# Patient Record
Sex: Male | Born: 1985 | Hispanic: Yes | Marital: Married
Health system: Southern US, Community
[De-identification: ages and names within clinical notes are randomized; demographics above are authoritative.]

---
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
## 2014-04-02 ENCOUNTER — Inpatient Hospital Stay: Payer: Self-pay | Admitting: Internal Medicine

## 2014-04-02 LAB — CBC WITH DIFFERENTIAL/PLATELET
BASOS PCT: 0.5 %
Basophil #: 0 10*3/uL (ref 0.0–0.1)
EOS ABS: 0 10*3/uL (ref 0.0–0.7)
Eosinophil %: 0.2 %
HCT: 40.7 % (ref 40.0–52.0)
HGB: 13.7 g/dL (ref 13.0–18.0)
LYMPHS ABS: 2 10*3/uL (ref 1.0–3.6)
LYMPHS PCT: 21.2 %
MCH: 25.9 pg — ABNORMAL LOW (ref 26.0–34.0)
MCHC: 33.5 g/dL (ref 32.0–36.0)
MCV: 77 fL — ABNORMAL LOW (ref 80–100)
MONO ABS: 0.9 x10 3/mm (ref 0.2–1.0)
Monocyte %: 9.5 %
NEUTROS ABS: 6.5 10*3/uL (ref 1.4–6.5)
Neutrophil %: 68.6 %
PLATELETS: 269 10*3/uL (ref 150–440)
RBC: 5.28 10*6/uL (ref 4.40–5.90)
RDW: 13.3 % (ref 11.5–14.5)
WBC: 9.5 10*3/uL (ref 3.8–10.6)

## 2014-04-02 LAB — BASIC METABOLIC PANEL
Anion Gap: 8 (ref 7–16)
BUN: 18 mg/dL (ref 7–18)
CHLORIDE: 105 mmol/L (ref 98–107)
CREATININE: 1.26 mg/dL (ref 0.60–1.30)
Calcium, Total: 9.2 mg/dL (ref 8.5–10.1)
Co2: 25 mmol/L (ref 21–32)
GLUCOSE: 118 mg/dL — AB (ref 65–99)
Osmolality: 279 (ref 275–301)
Potassium: 3.7 mmol/L (ref 3.5–5.1)
Sodium: 138 mmol/L (ref 136–145)

## 2014-04-02 LAB — URINALYSIS, COMPLETE
BLOOD: NEGATIVE
Bacteria: NONE SEEN
Bilirubin,UR: NEGATIVE
Glucose,UR: NEGATIVE mg/dL (ref 0–75)
Ketone: NEGATIVE
Leukocyte Esterase: NEGATIVE
Nitrite: NEGATIVE
PROTEIN: NEGATIVE
Ph: 8 (ref 4.5–8.0)
RBC,UR: 15 /HPF (ref 0–5)
Specific Gravity: 1.021 (ref 1.003–1.030)
Squamous Epithelial: NONE SEEN
WBC UR: 1 /HPF (ref 0–5)

## 2014-04-02 LAB — PROTIME-INR
INR: 1.1
Prothrombin Time: 13.9 secs (ref 11.5–14.7)

## 2014-04-02 LAB — GLUCOSE, CSF: Glucose, CSF: 66 mg/dL (ref 40–75)

## 2014-04-02 LAB — CSF CELL COUNT WITH DIFFERENTIAL
CSF TUBE #: 3
EOS PCT: 0 %
Lymphocytes: 24 %
Monocytes/Macrophages: 8 %
Neutrophils: 68 %
Other Cells: 0 %
RBC (CSF): 55 /mm3
WBC (CSF): 368 /mm3

## 2014-04-02 LAB — PROTEIN, CSF: PROTEIN, CSF: 76 mg/dL — AB (ref 15–45)

## 2014-04-06 LAB — CSF CULTURE W GRAM STAIN

## 2014-04-07 LAB — CULTURE, BLOOD (SINGLE)

## 2014-11-20 IMAGING — CR DG CHEST 1V PORT
1 series · 1 of 1 positions shown · non-contrast
Comparison: None.

CLINICAL DATA: Neck stiffness, headache, fever

EXAM:
PORTABLE CHEST - 1 VIEW

[x chest ap]
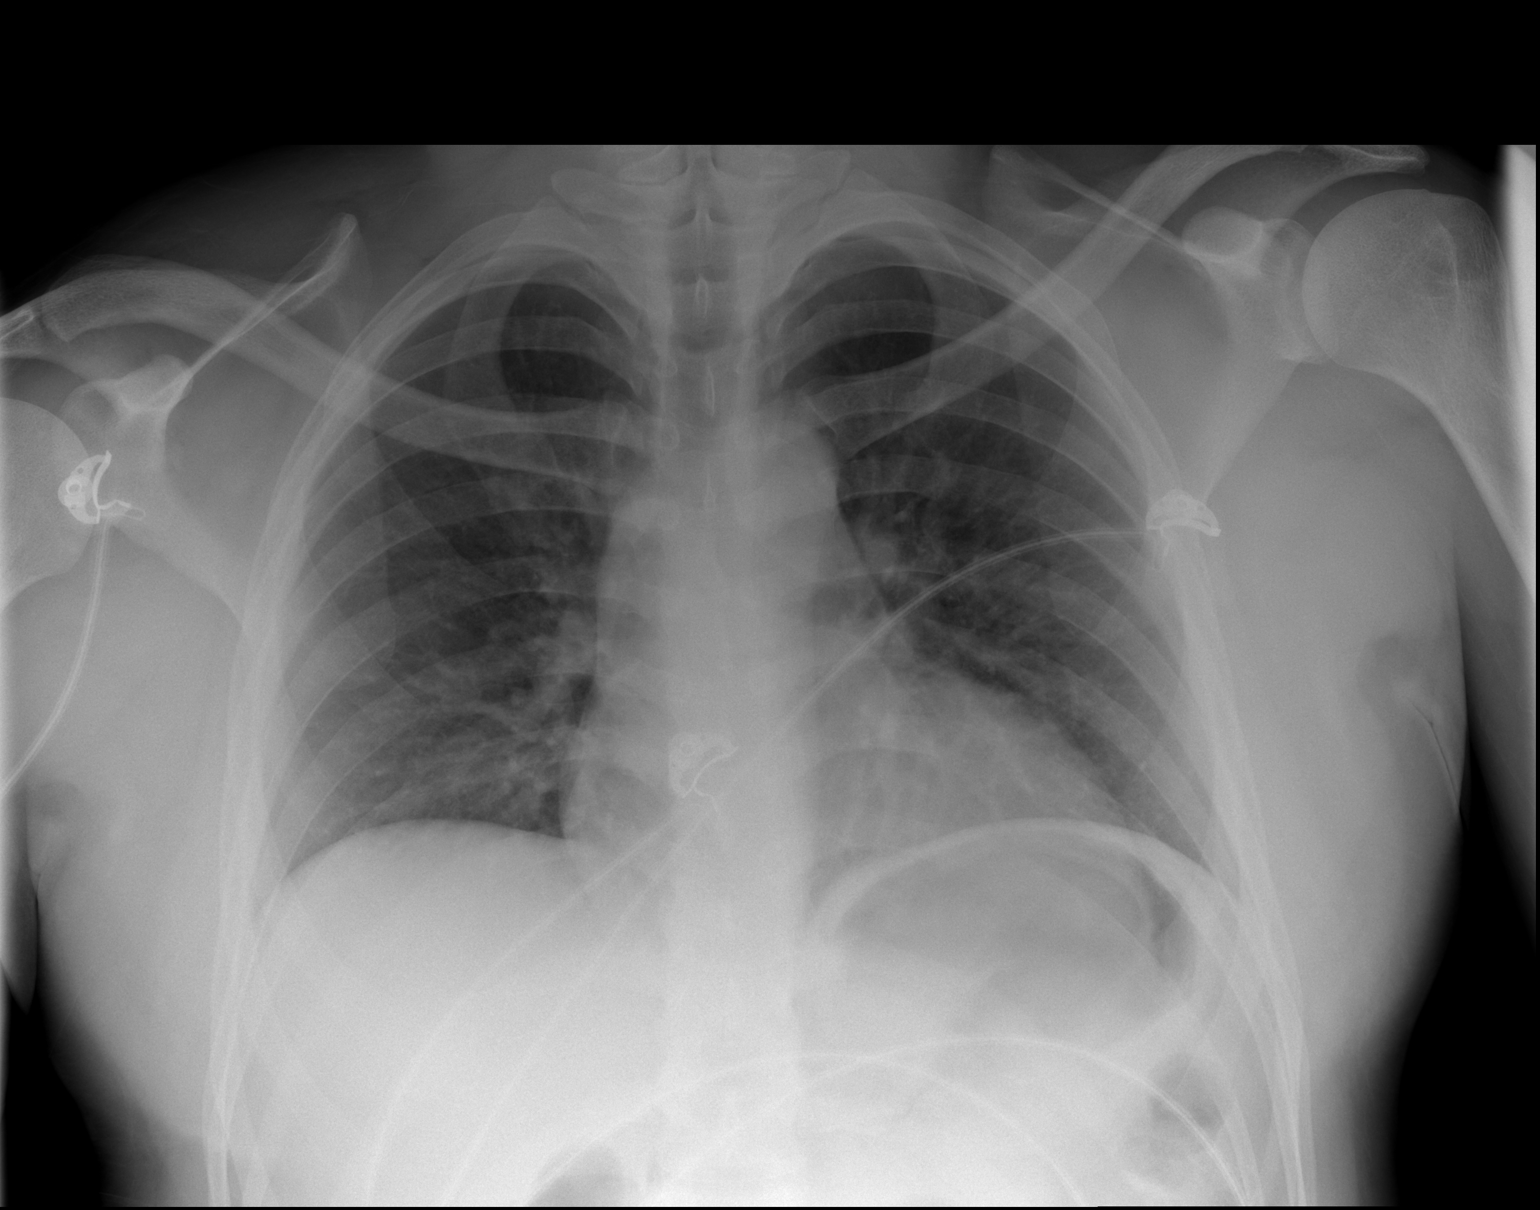

[1 of 1 positions shown; findings below may reference images not displayed]

FINDINGS: The lungs are not well aerated. However no pneumonia is seen. No
effusion is noted. Mediastinal and hilar contours are unremarkable.
The heart is within normal limits in size. No bony abnormality is
seen.
IMPRESSION: Suboptimal inspiration.  No active lung disease.

## 2014-11-20 IMAGING — CT CT HEAD WITHOUT CONTRAST
1 series · 16 of 30 positions shown, 20 images · non-contrast
Comparison: None.

CLINICAL DATA: Headache, fever, neck stiffness

EXAM:
CT HEAD WITHOUT CONTRAST
TECHNIQUE: Contiguous axial images were obtained from the base of the skull
through the vertex without intravenous contrast.

[Series 2: head wo · axial · 0.42mm/px · z∈[+1146,+1290]mm · 16 of 36 slices shown, 20 images]
[im 2/36  brain]
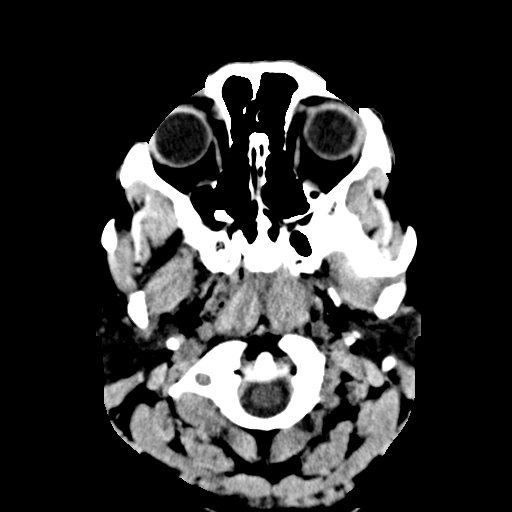
[im 2/36  bone]
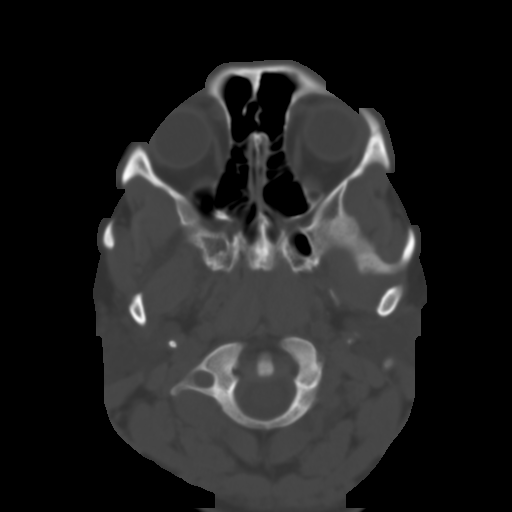
[im 4/36  brain]
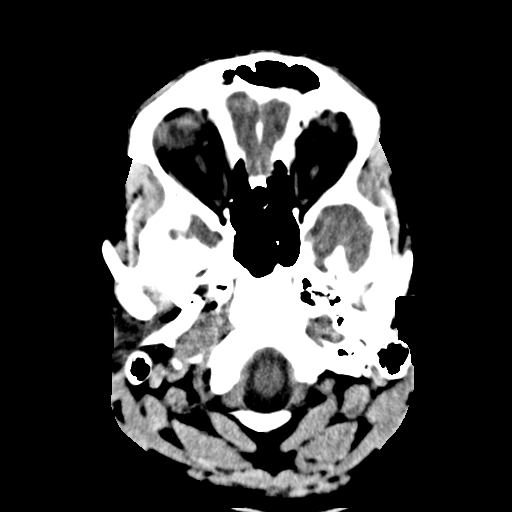
[im 7/36  brain]
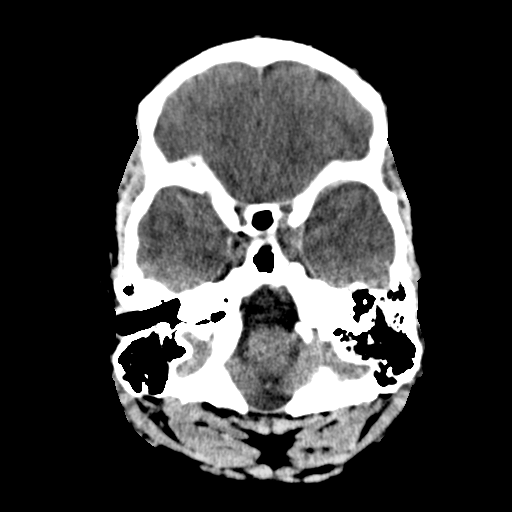
[im 9/36  brain]
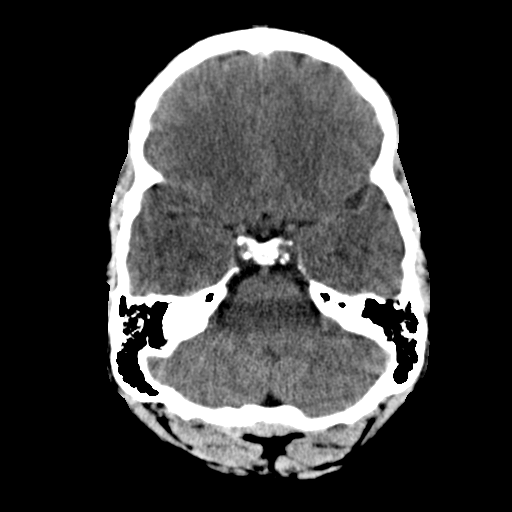
[im 10/36  brain]
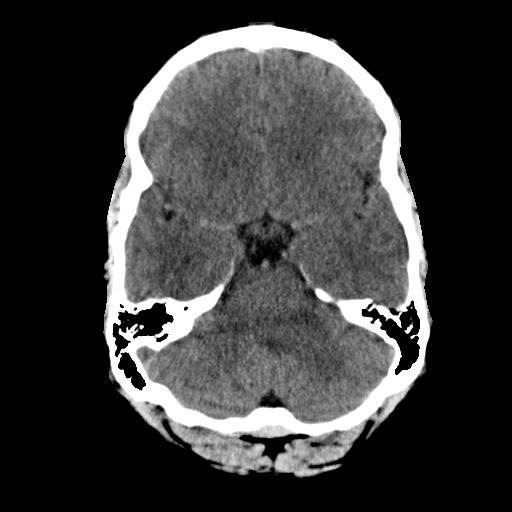
[im 10/36  bone]
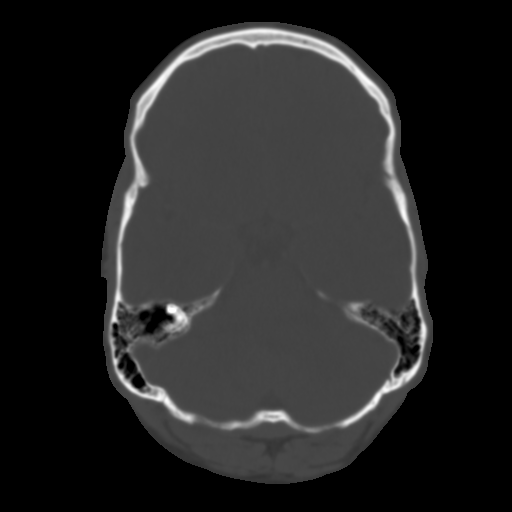
[im 13/36  brain]
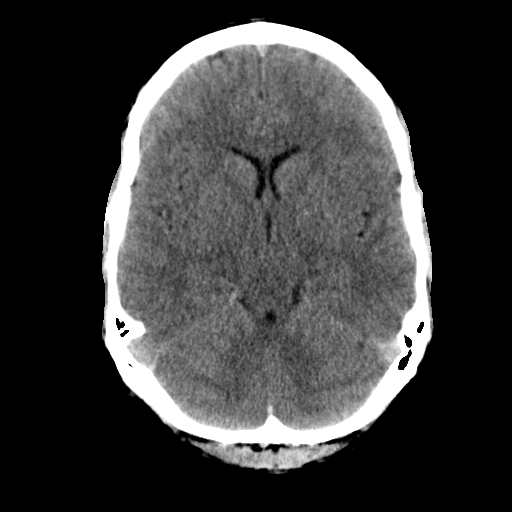
[im 15/36  brain]
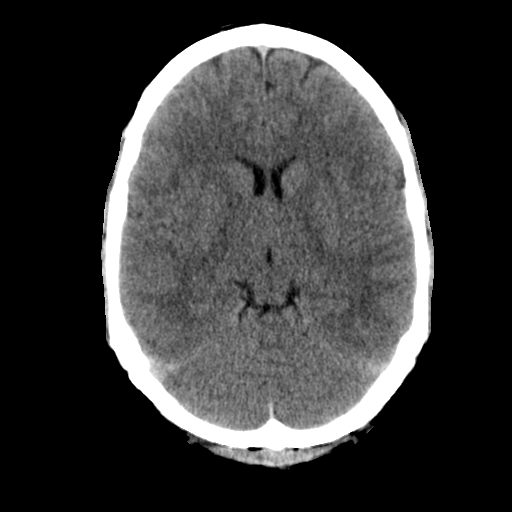
[im 17/36  brain]
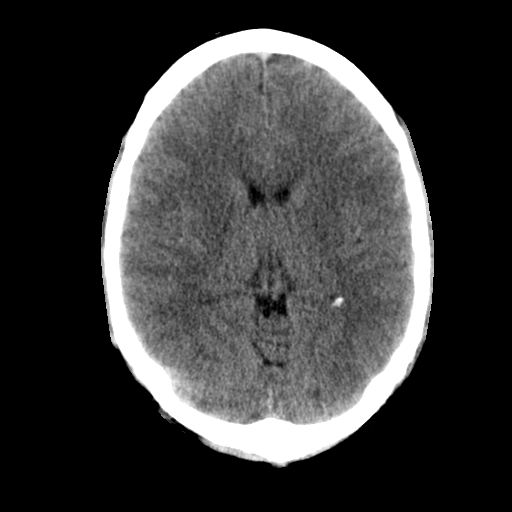
[im 19/36  brain]
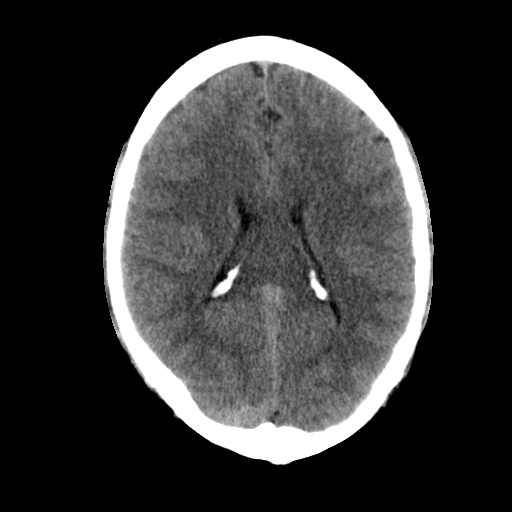
[im 19/36  bone]
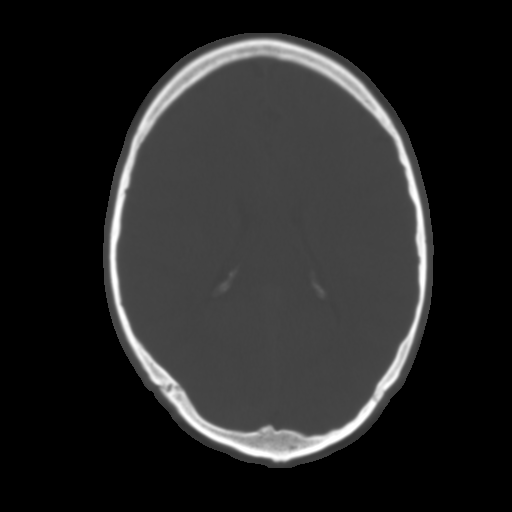
[im 21/36  brain]
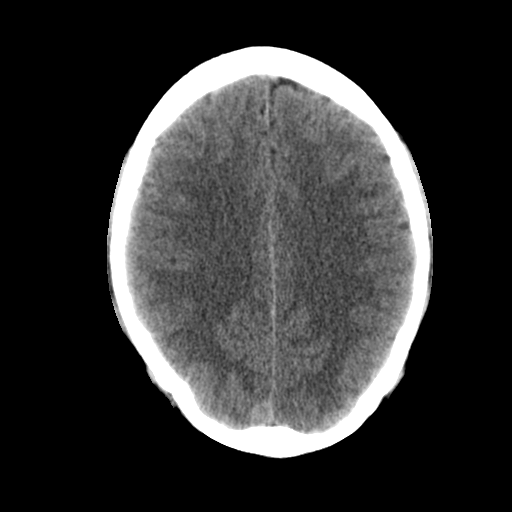
[im 23/36  brain]
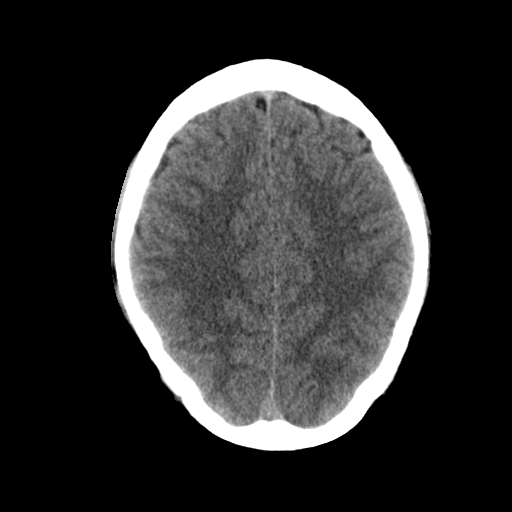
[im 26/36  brain]
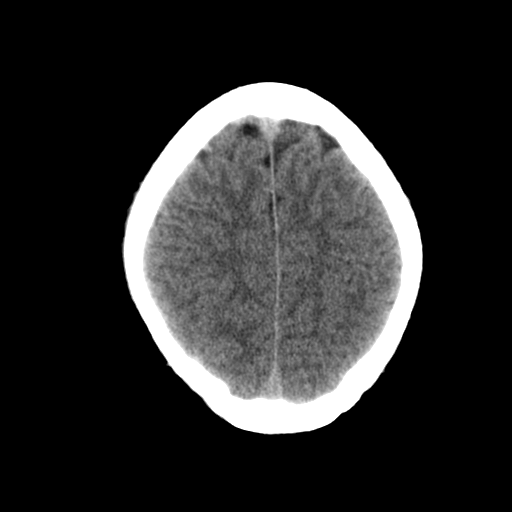
[im 27/36  brain]
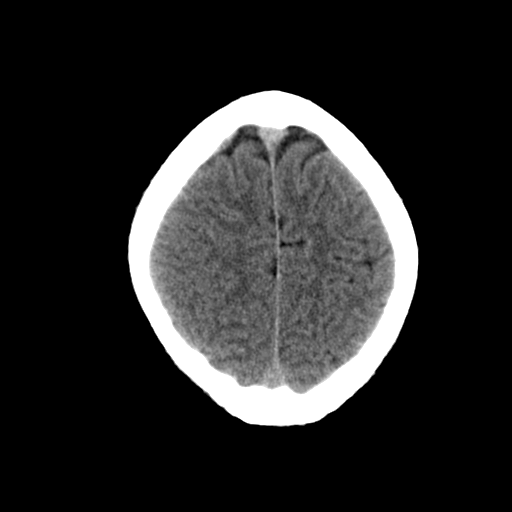
[im 27/36  bone]
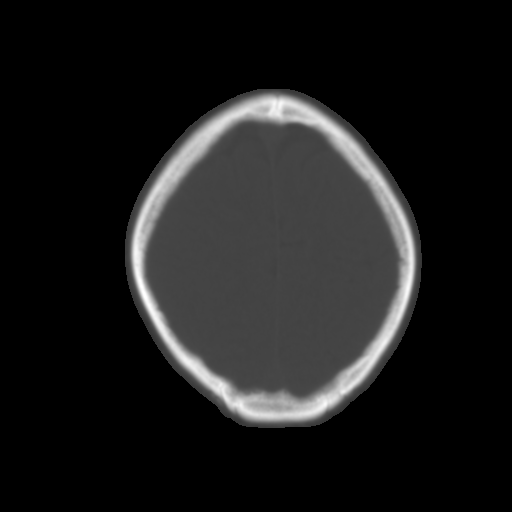
[im 29/36  brain]
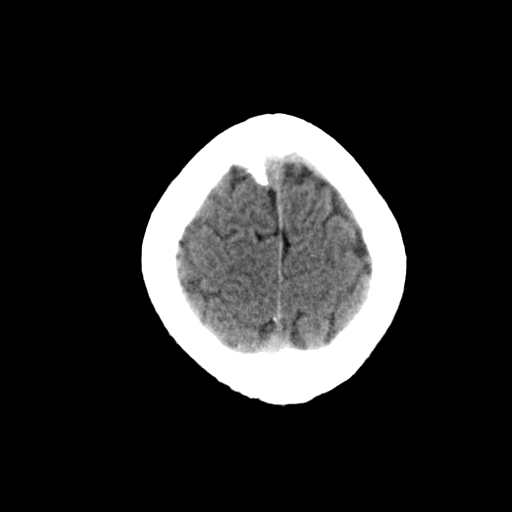
[im 32/36  brain]
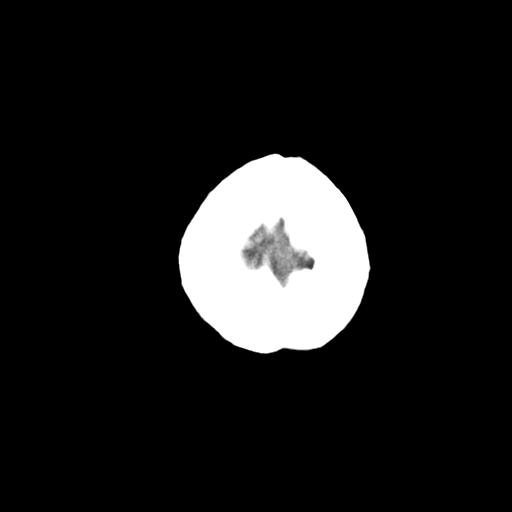
[im 34/36  brain]
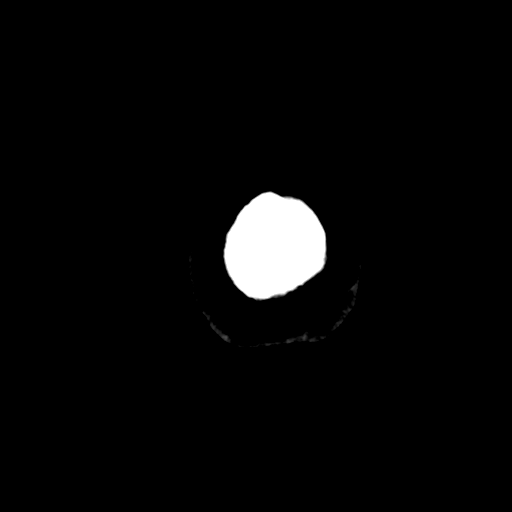

[16 of 30 positions shown; findings below may reference images not displayed]

FINDINGS: No skull fracture is noted. Paranasal sinuses and mastoid air cells
are unremarkable.

No hydrocephalus. No intracranial hemorrhage, mass effect or midline
shift. The gray and white-matter differentiation is preserved. No
acute infarction. No mass lesion is noted on this unenhanced scan.
IMPRESSION: No acute intracranial abnormality.

## 2015-01-24 NOTE — Discharge Summary (Signed)
PATIENT NAME:  Stuart Payne, Stuart Payne MR#:  [id number] DATE OF BIRTH:  [birth year]  DATE OF ADMISSION:  04/02/2014 DATE OF DISCHARGE:  04/03/2014  DISCHARGE DIAGNOSIS: Aseptic meningitis, improved.   SECONDARY DIAGNOSIS: None.   PROCEDURES AND RADIOLOGY: Lumbar puncture in the Emergency Department.   Chest x-ray on the 1st of July showed no acute cardiopulmonary disease.   CT scan of the head without contrast in the ED showed no acute intracranial abnormality.   MAJOR LABORATORY PANEL: Urinalysis on admission was negative. CSF culture had no growth, many WBCs. Blood cultures x2 were negative.   Malarial smear was negative.   HISTORY AND SHORT HOSPITAL COURSE: The patient is a 29 year old healthy male with no medical problems, who was admitted for fever and severe headache, thought to have meningitis. Please see Dr. Deatra Inaavid Hower's dictated history and physical for further details. The patient underwent lumbar puncture with CSF studies sent out, and they were all negative. Infectious disease consultation was obtained with Dr. Clydie Braunavid Fitzgerald, who recommended to stop all antibiotics, as the patient's culture remained negative. He was asymptomatic. This was thought to be aseptic meningitis, and the patient was discharged home the on 2nd of July, in stable condition. On the date of discharge, his vital signs are as follows: Temperature 98, heart rate 78 per minute, respirations 20 per minute, blood pressure 114/74 mmHg. He was saturating 96% on room air.   PERTINENT PHYSICAL EXAMINATION ON THE DATE OF DISCHARGE:  CARDIOVASCULAR: S1, S2 normal. No murmurs, rubs or gallop.  LUNGS: Clear to auscultation bilaterally. No wheezing, rales, rhonchi or crepitation.  ABDOMEN: Soft, benign.  NEUROLOGIC: Nonfocal examination.  All other physical examination remained at baseline.   DISCHARGE MEDICATIONS: None.   DISCHARGE DIET: Regular.   DISCHARGE ACTIVITY: As tolerated.   DISCHARGE INSTRUCTIONS AND  FOLLOW-UP: The patient was instructed to follow up with his physician [place] [facility] [place] [employer] [place] in 1 to 2 weeks if needed. He was also given the number for Dr. Clydie Braunavid Fitzgerald for follow up.   TOTAL TIME SPENT DISCHARGING THIS PATIENT: 40 minutes.   ____________________________ Ellamae SiaVipul S. Sherryll BurgerShah, MD vss:cg D: 04/04/2014 19:57:43 ET T: 04/05/2014 05:30:46 ET JOB#: [id number]  cc: Lockie Bothun S. Sherryll BurgerShah, MD, <Dictator> Stann Mainlandavid P. Sampson GoonFitzgerald, MD Ellamae SiaVIPUL S [facility] MD ELECTRONICALLY SIGNED 04/06/2014 18:41

## 2015-01-24 NOTE — H&P (Signed)
PATIENT NAME:  Stuart Payne, [name] MR#:  [id number] DATE OF BIRTH:  August 07, 1986  DATE OF ADMISSION:  04/02/2014  REFERRING PHYSICIAN:  Dr. Carollee MassedKaminski.   PRIMARY CARE PHYSICIAN:  Nonlocal.   CHIEF COMPLAINT:  Fever and headache.   HISTORY OF PRESENT ILLNESS:  A 29 year old Hispanic gentleman without significant past medical history presenting with fever and headache.  Describes one day duration of fever up to 103 degrees Fahrenheit at home with associated headache.  Describes headache as throbbing in the entire portion of his head and 10 out of 10 in intensity, nonradiating, worse with light as well as loud noise.  No relieving factors.  States that associated neck stiffness.  Of note, recently returned from [place] after a two week vacation.  Lumbar puncture performed in the Emergency Department.  Currently the patient is feeling much improved.   REVIEW OF SYSTEMS:   CONSTITUTIONAL:  Positive for fevers, chills.  Denies any fatigue, weakness.  EYES:  Denies blurred vision, double vision or eye pain.  EARS, NOSE, THROAT:  Denies tinnitus, ear pain, hearing loss.  RESPIRATORY:  Denies cough, wheeze, shortness of breath.  CARDIOVASCULAR:  Denies chest pain, palpitations, edema.  GASTROINTESTINAL:  Denies nausea, vomiting, diarrhea, abdominal pain.  GENITOURINARY:  Denies dysuria, hematuria.  ENDOCRINE:  Denies nocturia or thyroid problems.  HEMATOLOGIC AND LYMPHATIC:  Denies easy bruising, bleeding.  SKIN:  Denies rashes or lesions.  MUSCULOSKELETAL:  Positive for neck pain as described above.  Denies any back, shoulder, knee or hip pain.  Denies any arthritic symptoms.  NEUROLOGIC:  Denies any paralysis or paresthesias.  PSYCHIATRIC:  Denies anxiety or depressive symptoms.  Otherwise, full review of systems performed by me is negative.   PAST MEDICAL HISTORY:  None.   SOCIAL HISTORY:  Occasional alcohol as well as occasional tobacco usage.  Denies any drug usage.  Recent travel history to  [place] two week duration, just returned.   FAMILY HISTORY:  Denies any cardiovascular or pulmonary disorders.   ALLERGIES:  No known drug allergies.   HOME MEDICATIONS:  None.   PHYSICAL EXAMINATION: VITAL SIGNS:  Temperature 103 degrees Fahrenheit, heart rate 95, respirations 18, blood pressure 108/64, saturating 100% on room air.  Weight 79.4 kg, BMI 30.1.  GENERAL:  Well-nourished, well-developed Hispanic gentleman, currently in no acute distress.  HEAD:  Normocephalic, atraumatic.  EYES:  Pupils equal, round, reactive to light.  Extraocular muscles intact.  No scleral icterus.  MOUTH:  Moist mucosal membrane.  Dentition intact.  No abscess noted.  EAR, NOSE, THROAT:  Clear without exudates.  No external lesions.   NECK:  Currently neck supple, however the patient states this is different from his arrival.  No thyromegaly.  No nodules.  No JVD.  PULMONARY:  Clear to auscultation bilaterally without wheezes, rubs or rhonchi.  No use of accessory muscles.  Good respiratory effort.  CHEST:  Nontender to palpation.  CARDIOVASCULAR:  S1, S2, regular rate and rhythm.  No murmurs, rubs, or gallops.  No edema.  Pedal pulses 2+ bilaterally.  GASTROINTESTINAL:  Soft, nontender, nondistended.  No masses.  Positive bowel sounds.  No hepatosplenomegaly.  MUSCULOSKELETAL:  Denies swelling, clubbing, or edema.  Range of motion full in all extremities.  Brudzinski as well as Kernig's test are both within normal limits.  NEUROLOGIC:  Cranial nerves II through XII intact.  No gross focal neurological deficits.  Sensation intact.  Reflexes intact. SKIN:  No ulceration, lesions, rash or cyanosis.  Skin warm, dry.  Turgor intact.  PSYCHIATRIC:  Mood and affect within normal limits.  The patient awake, oriented x 3.  Insight and judgment intact.   LABORATORY DATA:  Sodium 138, potassium 3.7, chloride 105, bicarb 25, BUN 18, creatinine 1.26, glucose 118.  WBC 9.5, hemoglobin 13.7, platelets of 269.  INR 1.1.   Chest x-ray performed revealing no acute cardiopulmonary process.  CT head performed revealing no acute intracranial process.  Urinalysis negative for evidence of infection.  CSF studies WBCs 365, neutrophil predominance at 68, glucose 66, protein elevated at 76.  CSF, Gram stain, many WBCs and no organisms present.   ASSESSMENT AND PLAN:  A 29 year old Hispanic gentleman presenting with fevers as well as headache and treat for possible meningitis.  1.  Meningitis.  Lumbar puncture performed.  We will cover for bacterial meningitis and await the finalization of CSF results.  He does have leukocytosis 368, predominantly neutrophils which could represent early bacterial meningitis versus viral.  Regardless, ceftriaxone 2 grams intravenous twice daily, vancomycin 15 mg/kg twice daily as well as Decadron 0.15 mg/kg twice daily.  We will follow Gram stain and culture data.  Place on droplet precautions for the first 24 hours after initiation of antibiotic therapy.  If not bacterial meningitis, can discontinue isolation precautions.  If not strep pneumonia meningitis, can discontinue Decadron.  If it turns out to be neisseria meningococcal meningitis, provide Cipro 500 mg by mouth x 1 for all close contacts, meaning less than 3 feet for greater than eight hours.  2.  Venous thromboembolism prophylaxis with sequential compression devices.  3.  CODE STATUS:  THE PATIENT IS FULL CODE.   TIME SPENT:  45 minutes.    ____________________________ Cletis Athens. Hower, MD dkh:ea D: 04/02/2014 22:39:03 ET T: 04/02/2014 23:12:12 ET JOB#: [id number]  cc: Cletis Athens. Hower, MD, <Dictator> DAVID Synetta Shadow MD ELECTRONICALLY SIGNED 04/03/2014 2:13

## 2015-01-24 NOTE — Consult Note (Signed)
Brief Consult Note: Diagnosis: Aseptic meningitis likely viral.   Patient was seen by consultant.   Consult note dictated.   Recommend to proceed with surgery or procedure.   Recommend further assessment or treatment.   Orders entered.   Discussed with Attending MD.   Comments: Ok for dc off abx and follow cultures. I advised if worsens to return immedialely. I added hsv and enteroviral pcrs to csf already done.  Electronic Signatures: Dierdre HarnessFitzgerald, Lukah Goswami Patrick (MD)  (Signed 02-Jul-15 13:31)  Authored: Brief Consult Note   Last Updated: 02-Jul-15 13:31 by Dierdre HarnessFitzgerald, Danyia Borunda Patrick (MD)

## 2015-01-24 NOTE — Consult Note (Signed)
PATIENT NAME:  Stuart Payne, Stuart Payne MR#:  [id number] DATE OF BIRTH:  1986-03-11  DATE OF CONSULTATION:  04/03/2014  REFERRING PHYSICIAN:  Dr. Sherryll Burger. CONSULTING PHYSICIAN:  Stann Mainland. Sampson Goon, MD  REASON FOR CONSULT: Meningitis.   HISTORY OF PRESENT ILLNESS: This is a very pleasant 29 year old gentleman who is in the [employer] at [place]. He recently returned from a trip to [place] with his wife and child. On the way back, he started to develop a headache. He then developed high fevers and was having  chills. He came to the Emergency Room and had a lumbar puncture which revealed over 300 white blood cells. He was admitted and started on antibiotics and steroids.   The patient reports that while in [place], he visited family and friends. He had no animal contact. He, his wife, and daughter and other travelers with them also had a viral illness with fevers  that then resolved. None of them had any GI illnesses. He has not had any nausea, vomiting, diarrhea, or cough. Prior to this, he was in his usual state of health. He has no known tuberculosis contact. He is fully immunized in the [employer].   Since admission, he reports feeling much better. Tylenol alone is controlling the headache. He has not had any fevers. He is requesting discharge so he can go check back in at [facility] [place].   PAST SURGICAL HISTORY: None.   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 He is currently on leave. He recently traveled to [place] as above. He is sexually active with his wife only for the last 5 years. 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 He reports a negative HIV test last year.   FAMILY HISTORY: Noncontributory.   ALLERGIES: NO KNOWN DRUG ALLERGIES.   HOME MEDICATIONS: None.   ANTIBIOTICS SINCE ADMISSION: Vancomycin and ceftriaxone.   PHYSICAL EXAMINATION:  VITAL SIGNS: Temperature 98, pulse 78, blood pressure 114/74, respirations 20, saturations 96% on room air and afebrile since  admission.  GENERAL: He is well developed, well nourished, in no acute distress, sitting in bed.  HEENT: Pupils equal, round and reactive to light and accommodation. Extraocular movements are intact. Sclerae are anicteric. There is no injection.  Oropharynx is clear with no lesions or thrush.  NECK: Supple. He has no anterior cervical, posterior cervical or supraclavicular lymphadenopathy.  HEART: Regular.  LUNGS: Clear.  ABDOMEN: Soft, nontender, nondistended. No hepatosplenomegaly.  EXTREMITIES: No clubbing, cyanosis or edema.  SKIN: No rash or skin lesions.  NEUROLOGIC: He is alert and oriented x 3. Grossly nonfocal neuro exam. Cranial nerves II-XII are intact.   DATA: White blood count on admission 9.5, hemoglobin 13.7, platelets 269,000.  Normal differential.   INR was normal.  Renal panel was normal. CSF showed, on tube #3:  368 white cells, 55 red cells, 68% neutrophils, 24% lymphocytes. Protein is slightly elevated at 76, glucose was 66.  Cultures of the CSF are negative to date and blood cultures x 2 are no growth to date. Urinalysis showed one white cell.  Malaria smear is pending.  CT of the head showed no acute intracranial abnormalities. Chest x-ray showed suboptimal  inspiration, but no active lung disease.   IMPRESSION: A 29 year old gentleman recently returned from [place] who presents with aseptic meningitis. His family, on the trip, all had a viral illness characterized by fevers, but then resolved. Currently, he is clinically stable and has improved since admission and that is  just taking Tylenol to  control his headache. Afebrile. His CSF does have 300 white cells, but the is like  neutrophilic predominance. This is most likely a viral meningitis.  Especially characterized by the recent viral-like infections that his family had. This is most likely an enteroviral infection. 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 He says he has had a negative HIV test within the last year.    RECOMMENDATIONS: I would recommend he be discontinued on his antibiotics. He is requesting discharge so he can [facility] [place], and I think that would reasonable. I had a long discussion with him about warning signs and to return immediately to seek medical care if he worsens. I have added on HSV-2 and enteroviral TCRs to the CSF that was done.   Thank you for the consult. I will be glad to follow with you.    ____________________________ Stann Mainlandavid P. Sampson GoonFitzgerald, MD dpf:ts D: 04/04/2014 10:21:41 ET T: 04/04/2014 13:11:01 ET JOB#: [id number]  cc: Stann Mainlandavid P. Sampson GoonFitzgerald, MD, <Dictator> DAVID Sampson GoonFITZGERALD MD ELECTRONICALLY SIGNED 04/21/2014 21:14
